# Patient Record
Sex: Female | Born: 1995 | Race: White | Hispanic: No | Marital: Single | Smoking: Never smoker
Health system: Southern US, Community
[De-identification: ages and names within clinical notes are randomized; demographics above are authoritative.]

## PROBLEM LIST (undated history)

## (undated) DIAGNOSIS — G43909 Migraine, unspecified, not intractable, without status migrainosus: Secondary | ICD-10-CM

## (undated) DIAGNOSIS — F329 Major depressive disorder, single episode, unspecified: Secondary | ICD-10-CM

## (undated) DIAGNOSIS — F419 Anxiety disorder, unspecified: Secondary | ICD-10-CM

## (undated) HISTORY — PX: NO PAST SURGERIES: SHX2092

---
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
## 2017-06-12 ENCOUNTER — Emergency Department (HOSPITAL_COMMUNITY)
Admission: EM | Admit: 2017-06-12 | Discharge: 2017-06-13 | Disposition: A | Discharge status: Home / Self Care | Payer: BLUE CROSS/BLUE SHIELD | Attending: Emergency Medicine | Admitting: Emergency Medicine

## 2017-06-12 ENCOUNTER — Encounter (HOSPITAL_COMMUNITY): Payer: Self-pay

## 2017-06-12 DIAGNOSIS — R413 Other amnesia: Secondary | ICD-10-CM | POA: Insufficient documentation

## 2017-06-12 DIAGNOSIS — R519 Headache, unspecified: Secondary | ICD-10-CM

## 2017-06-12 DIAGNOSIS — Z79899 Other long term (current) drug therapy: Secondary | ICD-10-CM | POA: Insufficient documentation

## 2017-06-12 DIAGNOSIS — R51 Headache: Secondary | ICD-10-CM | POA: Insufficient documentation

## 2017-06-12 HISTORY — DX: Major depressive disorder, single episode, unspecified: F32.9

## 2017-06-12 HISTORY — DX: Migraine, unspecified, not intractable, without status migrainosus: G43.909

## 2017-06-12 NOTE — ED Triage Notes (Signed)
Patient reports a migraine headache approx 6 days ago with intermittent nausea, but denies vomiting. Patient states during conversation she completely loses her train of thought. patient states this memory loss started 04/03/17 and has gotten progressively worse. Patient states she has been taking Topramax for the migraines,mb ut her migraine returned. Patient state she has continued taking the Topramax.

## 2017-06-13 ENCOUNTER — Emergency Department (HOSPITAL_COMMUNITY): Payer: BLUE CROSS/BLUE SHIELD

## 2017-06-13 LAB — POC URINE PREG, ED: PREG TEST UR: NEGATIVE

## 2017-06-13 NOTE — ED Provider Notes (Signed)
[facility]-EMERGENCY DEPT Provider Note   CSN: [id number] Arrival date & time: 06/12/17  1808     History   Chief Complaint Chief Complaint  Patient presents with  . Migraine  . Memory Loss    HPI Jaunice Mirza is a 21 y.o. female.  Patient presents to the emergency department for evaluation of worsening neurologic symptoms that have been ongoing for some time.  Patient reports that she has a history of chronic headaches since she was 21 years old.  She is in the area going to college, has a neurologist in [place].  She saw her neurologist a couple of times over the summer for worsening headaches, memory loss.  Patient has been on Topamax and her neurologist wanted to increase the Topamax, but patient thought that was causing her memory loss, increase was never done.  Patient reports that she had an episode 3 days ago where she had numbness and tingling of both of her hands and around her mouth.  It lasted for 4 hours and then resolved.  Tonight she has had increased headache.  She reports that the headache is always there, every day and never leaves, waxes and wanes.  It is 6 out of 10 now.  Patient also reports that she was in class earlier today, did not remember going to class or anything happening in the class, suddenly became aware where she was when the class was being dismissed.  She reports that she had taken notes, but had no recollection of that.      Past Medical History:  Diagnosis Date  . Anxiety   . Major depression   . Migraines     Patient Active Problem List   Diagnosis Date Noted  . Adjustment disorder with mixed anxiety and depressed mood 09/17/2016    Past Surgical History:  Procedure Laterality Date  . NO PAST SURGERIES      OB History    No data available       Home Medications    Prior to Admission medications   Medication Sig Start Date End Date Taking? Authorizing Provider  buPROPion (WELLBUTRIN XL) 150 MG 24 hr  tablet Take 150 mg by mouth daily. 05/26/17  Yes [provider]  buPROPion (WELLBUTRIN XL) 300 MG 24 hr tablet Take 300 mg by mouth daily. 05/23/17  Yes [provider]  hydrOXYzine (ATARAX/VISTARIL) 25 MG tablet Take 1 tablet (25 mg total) by mouth every 6 (six) hours as needed for anxiety. 09/18/16  Yes Withrow, Everardo All, FNP  MICROGESTIN FE 1/20 1-20 MG-MCG tablet Take 1 tablet by mouth daily. 07/12/16  Yes [provider]  propranolol (INDERAL) 10 MG tablet Take 10 mg by mouth daily as needed. 03/08/17  Yes [provider]  topiramate (TOPAMAX) 25 MG tablet Take 25 mg by mouth 2 (two) times daily. 05/22/17  Yes [provider]  citalopram (CELEXA) 10 MG tablet Take 1 tablet (10 mg total) by mouth daily. Patient not taking: Reported on 06/13/2017 09/19/16   Beau Fanny, FNP    Family History Family History  Problem Relation Age of Onset  . Anxiety disorder Mother   . Depression Mother     Social History Social History  Substance Use Topics  . Smoking status: Never Smoker  . Smokeless tobacco: Never Used  . Alcohol use No     Allergies   Lactose intolerance (gi) and Tamiflu [oseltamivir phosphate]   Review of Systems Review of Systems  Neurological: Positive  for numbness and headaches.  Psychiatric/Behavioral: Positive for decreased concentration.  All other systems reviewed and are negative.    Physical Exam Updated Vital Signs BP 126/75 (BP Location: Left Arm)   Pulse 87   Temp 98.5 F (36.9 C) (Oral)   Resp 16   Ht 5\' 4"  (1.626 m)   Wt 54.4 kg (120 lb)   LMP 05/29/2017   SpO2 100%   BMI 20.60 kg/m   Physical Exam  Constitutional: She is oriented to person, place, and time. She appears well-developed and well-nourished. No distress.  HENT:  Head: Normocephalic and atraumatic.  Right Ear: Hearing normal.  Left Ear: Hearing normal.  Nose: Nose normal.  Mouth/Throat: Oropharynx is clear and moist and mucous  membranes are normal.  Eyes: Pupils are equal, round, and reactive to light. Conjunctivae and EOM are normal.  Neck: Normal range of motion. Neck supple.  Cardiovascular: Regular rhythm, S1 normal and S2 normal.  Exam reveals no gallop and no friction rub.   No murmur heard. Pulmonary/Chest: Effort normal and breath sounds normal. No respiratory distress. She exhibits no tenderness.  Abdominal: Soft. Normal appearance and bowel sounds are normal. There is no hepatosplenomegaly. There is no tenderness. There is no rebound, no guarding, no tenderness at McBurney's point and negative Murphy's sign. No hernia.  Musculoskeletal: Normal range of motion.  Neurological: She is alert and oriented to person, place, and time. She has normal strength. No cranial nerve deficit or sensory deficit. Coordination normal. GCS eye subscore is 4. GCS verbal subscore is 5. GCS motor subscore is 6.  Skin: Skin is warm, dry and intact. No rash noted. No cyanosis.  Psychiatric: She has a normal mood and affect. Her speech is normal and behavior is normal. Thought content normal.  Nursing note and vitals reviewed.    ED Treatments / Results  Labs (all labs ordered are listed, but only abnormal results are displayed) Labs Reviewed - No data to display  EKG  EKG Interpretation None       Radiology Ct Head Wo Contrast  Result Date: 06/13/2017 CLINICAL DATA:  Acute onset of migraine headache and nausea. Memory loss. Initial encounter. EXAM: CT HEAD WITHOUT CONTRAST TECHNIQUE: Contiguous axial images were obtained from the base of the skull through the vertex without intravenous contrast. COMPARISON:  None. FINDINGS: Brain: No evidence of acute infarction, hemorrhage, hydrocephalus, extra-axial collection or mass lesion/mass effect. The posterior fossa, including the cerebellum, brainstem and fourth ventricle, is within normal limits. The third and lateral ventricles, and basal ganglia are unremarkable in  appearance. The cerebral hemispheres are symmetric in appearance, with normal gray-white differentiation. No mass effect or midline shift is seen. Vascular: No hyperdense vessel or unexpected calcification. Skull: There is no evidence of fracture; visualized osseous structures are unremarkable in appearance. Sinuses/Orbits: The orbits are within normal limits. The paranasal sinuses and mastoid air cells are well-aerated. Other: No significant soft tissue abnormalities are seen. IMPRESSION: Unremarkable noncontrast CT of the head. Electronically Signed   By: Roanna Raider M.D.   On: 06/13/2017 01:25    Procedures Procedures (including critical care time)  Medications Ordered in ED Medications - No data to display   Initial Impression / Assessment and Plan / ED Course  I have reviewed the triage vital signs and the nursing notes.  Pertinent labs & imaging results that were available during my care of the patient were reviewed by me and considered in my medical decision making (see chart for details).  Patient presents to the ER for evaluation of chronic and worsening headache with associated neurologic features.  Patient reports worsening memory loss.  She had perioral and bilateral hand numbness and tingling several days ago.  This sounds consistent with hyperventilation syndrome, likely has some anxiety and psychiatric component to her symptoms.  [place] in [place] as well as neurology.  Patient reporting worsening headaches, however, therefore head CT had performed.  Will refer to neurology for further help with her headaches and neurologic symptoms.  Final Clinical Impressions(s) / ED Diagnoses   Final diagnoses:  Memory loss  Bad headache    New Prescriptions New Prescriptions   No medications on file     Gilda CreasePollina, Christopher J, MD 06/13/17 0202

## 2017-06-28 ENCOUNTER — Encounter: Payer: Self-pay | Admitting: Neurology

## 2017-10-12 ENCOUNTER — Ambulatory Visit (INDEPENDENT_AMBULATORY_CARE_PROVIDER_SITE_OTHER): Payer: BLUE CROSS/BLUE SHIELD | Admitting: Neurology

## 2017-10-12 ENCOUNTER — Encounter: Payer: Self-pay | Admitting: Neurology

## 2017-10-12 VITALS — BP 96/68 | HR 92 | Ht 64.0 in | Wt 116.4 lb

## 2017-10-12 DIAGNOSIS — G43709 Chronic migraine without aura, not intractable, without status migrainosus: Secondary | ICD-10-CM | POA: Diagnosis not present

## 2017-10-12 MED ORDER — AMITRIPTYLINE HCL 25 MG PO TABS: 25.0000 mg | ORAL_TABLET | Freq: Every day | ORAL | 3 refills | Status: DC

## 2017-10-12 NOTE — Progress Notes (Addendum)
NEUROLOGY CONSULTATION NOTE  Kristin Wagner MRN: [id number] DOB: 08/03/1996  Referring provider: ED referral Primary care provider: No PCP  Reason for consult:  headache  HISTORY OF PRESENT ILLNESS: Kristin Wagner is a  22 year old female with major depression who presents for headache and memory loss.  History supplemented by ED notes.    She has had a constant headache since she was 22 years old.  Semiology changes.  Location of headaches are variable (retro-orbital, frontal, temporal, unilateral or bilateral), often squeezing sensation.  They are dull with fluctuations to severe.  Severe headaches occur about 3 to 4 days a week and typically last 4 hours.  There is associated nausea and some photophobia and phonophobia.  No unilateral numbness or weakness.  Stress is a significant trigger.  Nothing is effective in relieving it.  She once had a visual aura with scotoma but otherwise no visual symptoms.  She is currently not taking any pain relievers. Current NSAIDS:  no Current analgesics:  no Current triptans:  no Current anti-emetic:  no Current muscle relaxants:  no Current anti-anxiolytic:  hydroxyzine Current sleep aide:  no Current Antihypertensive medications:  no Current Antidepressant medications:  Amitriptyline 10mg  to 20mg  at bedtime, bupropion Current Anticonvulsant medications:  topiramate 25mg  twice daily Current Vitamins/Herbal/Supplements:  no Current Antihistamines/Decongestants:  no Other therapy:  no  Past NSAIDS:  ibuprofen Past analgesics:  Excedrin Migraine (ineffective) Past abortive triptans:  no Past muscle relaxants:  no Past anti-emetic:  no Past antihypertensive medications:  Propranolol 10mg  (increased depression) Past antidepressant medications:  citalopram Past anticonvulsant medications:  no Past vitamins/Herbal/Supplements:  no Past antihistamines/decongestants:  no Other past therapies:  no  Caffeine:  no Alcohol:  no Smoker:   no Diet:  Hydrates.   Exercise:  walks Depression:  Yes but stable; Anxiety:  yes Sleep hygiene:  varies Family history of headache:  no  She goes to school in [place] but is from [place], where she has a neurologist and psychiatrist.  On 06/12/17, she presented to the ED with worsening headache.  For several months, she has had memory problems.  She saw a neurologist in [place] who thought it might be due to the topiramate, however symptoms started prior to topiramate.  Sometimes she does not understand the teacher in class.  On day of her ED visit, she had an amnestic episode in school.  She suddenly found herself sitting in class and did not remember going to class or anything that occurred while in class.  She looked down at her desk and saw that she had written notes but had no recollection of her actions.  She also endorsed a prior episode of numbness and tingling of both hands and around her mouth lasting 4 hours.  CT of head was personally reviewed and was unremarkable.  She has major depression.  She also has been seen in urgent care and the ED for atypical chest pain with shortness of breath.  She has a Therapist, sports in [place].  She says she is doing well in school.  PAST MEDICAL HISTORY: Past Medical History:  Diagnosis Date  . Anxiety   . Major depression   . Migraines     PAST SURGICAL HISTORY: Past Surgical History:  Procedure Laterality Date  . NO PAST SURGERIES      MEDICATIONS: Current Outpatient Medications on File Prior to Visit  Medication Sig Dispense Refill  . buPROPion (WELLBUTRIN XL) 150 MG 24 hr tablet Take 450 mg by mouth daily.  0  . buPROPion (WELLBUTRIN XL) 300 MG 24 hr tablet Take 300 mg by mouth daily.  0  . citalopram (CELEXA) 10 MG tablet Take 1 tablet (10 mg total) by mouth daily. (Patient not taking: Reported on 06/13/2017) 30 tablet 0  . hydrOXYzine (ATARAX/VISTARIL) 25 MG tablet Take 1 tablet (25 mg total) by mouth every 6 (six) hours as  needed for anxiety. 60 tablet 0  . MICROGESTIN FE 1/20 1-20 MG-MCG tablet Take 1 tablet by mouth daily.  3  . propranolol (INDERAL) 10 MG tablet Take 10 mg by mouth daily as needed.  1  . topiramate (TOPAMAX) 25 MG tablet Take 25 mg by mouth 2 (two) times daily.  0   No current facility-administered medications on file prior to visit.     ALLERGIES: Allergies  Allergen Reactions  . Lactose Intolerance (Gi)   . Tamiflu [Oseltamivir Phosphate] Hives and Nausea And Vomiting    FAMILY HISTORY: Family History  Problem Relation Age of Onset  . Anxiety disorder Mother   . Depression Mother    .  SOCIAL HISTORY: Social History   Socioeconomic History  . Marital status: Single    Spouse name: Not on file  . Number of children: Not on file  . Years of education: Not on file  . Highest education level: Not on file  Social Needs  . Financial resource strain: Not on file  . Food insecurity - worry: Not on file  . Food insecurity - inability: Not on file  . Transportation needs - medical: Not on file  . Transportation needs - non-medical: Not on file  Occupational History  . Not on file  Tobacco Use  . Smoking status: Never Smoker  . Smokeless tobacco: Never Used  Substance and Sexual Activity  . Alcohol use: No  . Drug use: No  . Sexual activity: Not Currently    Birth control/protection: Pill  Other Topics Concern  . Not on file  Social History Narrative  . Not on file    REVIEW OF SYSTEMS: Constitutional: No fevers, chills, or sweats, no generalized fatigue, change in appetite Eyes: No visual changes, double vision, eye pain Ear, nose and throat: No hearing loss, ear pain, nasal congestion, sore throat Cardiovascular: No chest pain, palpitations Respiratory:  No shortness of breath at rest or with exertion, wheezes GastrointestinaI: No nausea, vomiting, diarrhea, abdominal pain, fecal incontinence Genitourinary:  No dysuria, urinary retention or  frequency Musculoskeletal:  No neck pain, back pain Integumentary: No rash, pruritus, skin lesions Neurological: as above Psychiatric: depression, insomnia, anxiety Endocrine: No palpitations, fatigue, diaphoresis, mood swings, change in appetite, change in weight, increased thirst Hematologic/Lymphatic:  No purpura, petechiae. Allergic/Immunologic: no itchy/runny eyes, nasal congestion, recent allergic reactions, rashes  PHYSICAL EXAM: Vitals:   10/12/17 1458  BP: 96/68  Pulse: 92  SpO2: 98%   General: No acute distress.  Patient appears well-groomed.  Head:  Normocephalic/atraumatic Eyes:  fundi examined but not visualized Neck: supple, no paraspinal tenderness, full range of motion Back: No paraspinal tenderness Heart: regular rate and rhythm Lungs: Clear to auscultation bilaterally. Vascular: No carotid bruits. Neurological Exam: Mental status: alert and oriented to person, place, and time, recent and remote memory intact, fund of knowledge intact, attention and concentration intact, speech fluent and not dysarthric, language intact. Cranial nerves: CN I: not tested CN II: pupils equal, round and reactive to light, visual fields intact CN III, IV, VI:  full range of motion, no nystagmus, no ptosis CN V:  facial sensation intact CN VII: upper and lower face symmetric CN VIII: hearing intact CN IX, X: gag intact, uvula midline CN XI: sternocleidomastoid and trapezius muscles intact CN XII: tongue midline Bulk & Tone: normal, no fasciculations. Motor:  5/5 throughout  Sensation: temperature and vibration sensation intact. Deep Tendon Reflexes:  2+ throughout, toes downgoing.  Finger to nose testing:  Without dysmetria.  Heel to shin:  Without dysmetria.  Gait:  Normal station and stride.  Able to turn. Romberg negative.  IMPRESSION: Chronic migraine without aura Memory deficits likely related to underlying depression and not organic.  Due to current memory symptoms, I  would rather not increase topiramate.  We will increase amitriptyline instead.  PLAN: 1.  I will prescribe you a higher dose of amitriptyline.  Take 25mg  at bedtime.  If headaches not improved in 4 weeks, contact me and I will increase dose. 2.  Limit use of pain relievers to no more than 2 days out of the week.  These medications include acetaminophen, ibuprofen, triptans and narcotics.  This will help reduce risk of rebound headaches. 3.  Be aware of common food triggers such as processed sweets, processed foods with nitrites (such as deli meat, hot dogs, sausages), foods with MSG, alcohol (such as wine), chocolate, certain cheeses, certain fruits (dried fruits, bananas, some citrus fruit), vinegar, diet soda. 4.  Avoid caffeine 5.  Routine exercise 6.  Proper sleep hygiene 7.  Stay adequately hydrated with water 8.  Keep a headache diary. 9.  Maintain proper stress management. 10.  Do not skip meals. 11.  Consider supplements:  Magnesium citrate 400mg  to 600mg  daily, riboflavin 400mg , Coenzyme Q 10 100mg  three times daily 12.  Follow up in 3 months.  Shon MilletAdam Jaffe, DO

## 2017-10-12 NOTE — Patient Instructions (Signed)
1.  I will prescribe you a higher dose of amitriptyline.  Take 25mg  at bedtime.  If headaches not improved in 4 weeks, contact me and I will increase dose. 2.  Limit use of pain relievers to no more than 2 days out of the week.  These medications include acetaminophen, ibuprofen, triptans and narcotics.  This will help reduce risk of rebound headaches. 3.  Be aware of common food triggers such as processed sweets, processed foods with nitrites (such as deli meat, hot dogs, sausages), foods with MSG, alcohol (such as wine), chocolate, certain cheeses, certain fruits (dried fruits, bananas, some citrus fruit), vinegar, diet soda. 4.  Avoid caffeine 5.  Routine exercise 6.  Proper sleep hygiene 7.  Stay adequately hydrated with water 8.  Keep a headache diary. 9.  Maintain proper stress management. 10.  Do not skip meals. 11.  Consider supplements:  Magnesium citrate 400mg  to 600mg  daily, riboflavin 400mg , Coenzyme Q 10 100mg  three times daily 12.  Follow up in 3 months.

## 2017-11-23 ENCOUNTER — Encounter: Payer: Self-pay | Admitting: Neurology

## 2017-12-15 ENCOUNTER — Encounter: Payer: Self-pay | Admitting: Neurology

## 2018-01-26 ENCOUNTER — Ambulatory Visit (INDEPENDENT_AMBULATORY_CARE_PROVIDER_SITE_OTHER): Payer: BLUE CROSS/BLUE SHIELD | Admitting: Neurology

## 2018-01-26 ENCOUNTER — Encounter: Payer: Self-pay | Admitting: Neurology

## 2018-01-26 VITALS — BP 100/68 | HR 108 | Ht 64.0 in | Wt 124.0 lb

## 2018-01-26 DIAGNOSIS — G43709 Chronic migraine without aura, not intractable, without status migrainosus: Secondary | ICD-10-CM | POA: Diagnosis not present

## 2018-01-26 MED ORDER — TOPIRAMATE 25 MG PO TABS: ORAL_TABLET | ORAL | 0 refills | Status: DC

## 2018-01-26 MED ORDER — NAPROXEN 500 MG PO TABS: 500.0000 mg | ORAL_TABLET | Freq: Two times a day (BID) | ORAL | 2 refills | Status: AC | PRN

## 2018-01-26 NOTE — Progress Notes (Signed)
NEUROLOGY FOLLOW UP OFFICE NOTE  Kristin Wagner [id number]  HISTORY OF PRESENT ILLNESS: Kristin HaiCourtney Teehan is a 22 year old female with major depression who presents for headache and memory loss.    UPDATE: Last time, amitriptyline was increased from 10mg  to 25mg .  Migraines slightly improved.  She still has dull daily headaches.  As for migraines:   Intensity:  Moderate to severe Duration:  4 to 6 hours Frequency:  2 to 3 days a week Current NSAIDS:  no Current analgesics:  no Current triptans:  no Current anti-emetic:  no Current muscle relaxants:  no Current anti-anxiolytic:  hydroxyzine Current sleep aide:  no Current Antihypertensive medications:  no Current Antidepressant medications:  Amitriptyline 25mg  to 20mg  at bedtime, bupropion Current Anticonvulsant medications:  topiramate 25mg  once daily Current Vitamins/Herbal/Supplements:  no Current Antihistamines/Decongestants:  no Other therapy:  no  Caffeine:  no Alcohol:  no Smoker:  no Diet:  Hydrates.   Exercise:  walks Depression:  Yes but stable; Anxiety:  yes Sleep hygiene:  varies  HISTORY:  She has had a constant headache since she was 22 years old.  Semiology changes.  Location of headaches are variable (retro-orbital, frontal, temporal, unilateral or bilateral), often squeezing sensation.  They are dull with fluctuations to severe.  Severe headaches occur about 3 to 4 days a week and typically last 4 hours.  There is associated nausea and some photophobia and phonophobia.  No unilateral numbness or weakness.  Stress is a significant trigger.  Nothing is effective in relieving it.  She once had a visual aura with scotoma but otherwise no visual symptoms.    Past NSAIDS:  ibuprofen Past analgesics:  Excedrin Migraine (ineffective) Past abortive triptans:  no Past muscle relaxants:  no Past anti-emetic:  no Past antihypertensive medications:  Propranolol 10mg  (increased depression) Past antidepressant  medications:  citalopram Past anticonvulsant medications:  no Past vitamins/Herbal/Supplements:  no Past antihistamines/decongestants:  no Other past therapies:  no   Family history of headache:  no   She goes to school in [place] but is from [place], where she has a neurologist and psychiatrist.  On 06/12/17, she presented to the ED with worsening headache.  For several months, she has had memory problems.  She saw a neurologist in [place] who thought it might be due to the topiramate, however symptoms started prior to topiramate.  Sometimes she does not understand the teacher in class.  On day of her ED visit, she had an amnestic episode in school.  She suddenly found herself sitting in class and did not remember going to class or anything that occurred while in class.  She looked down at her desk and saw that she had written notes but had no recollection of her actions.  She also endorsed a prior episode of numbness and tingling of both hands and around her mouth lasting 4 hours.  CT of head was personally reviewed and was unremarkable.   She has major depression.  She also has been seen in urgent care and the ED for atypical chest pain with shortness of breath.   She has a Therapist, sportspsychiatrist in [place].  She says she is doing well in school.  PAST MEDICAL HISTORY: Past Medical History:  Diagnosis Date  . Anxiety   . Major depression   . Migraines     MEDICATIONS: Current Outpatient Medications on File Prior to Visit  Medication Sig Dispense Refill  . buPROPion (WELLBUTRIN XL) 150 MG 24 hr tablet Take 450 mg  by mouth daily.   0  . buPROPion (WELLBUTRIN XL) 300 MG 24 hr tablet Take 300 mg by mouth daily.  0  . hydrOXYzine (ATARAX/VISTARIL) 25 MG tablet Take 1 tablet (25 mg total) by mouth every 6 (six) hours as needed for anxiety. 60 tablet 0   No current facility-administered medications on file prior to visit.     ALLERGIES: Allergies  Allergen Reactions  . Lactose Intolerance  (Gi)   . Tamiflu [Oseltamivir Phosphate] Hives and Nausea And Vomiting    FAMILY HISTORY: Family History  Problem Relation Age of Onset  . Anxiety disorder Mother   . Depression Mother     SOCIAL HISTORY: Social History   Socioeconomic History  . Marital status: Single    Spouse name: Not on file  . Number of children: Not on file  . Years of education: Not on file  . Highest education level: Not on file  Occupational History  . Not on file  Social Needs  . Financial resource strain: Not on file  . Food insecurity:    Worry: Not on file    Inability: Not on file  . Transportation needs:    Medical: Not on file    Non-medical: Not on file  Tobacco Use  . Smoking status: Never Smoker  . Smokeless tobacco: Never Used  Substance and Sexual Activity  . Alcohol use: No  . Drug use: No  . Sexual activity: Not Currently    Birth control/protection: Pill  Lifestyle  . Physical activity:    Days per week: Not on file    Minutes per session: Not on file  . Stress: Not on file  Relationships  . Social connections:    Talks on phone: Not on file    Gets together: Not on file    Attends religious service: Not on file    Active member of club or organization: Not on file    Attends meetings of clubs or organizations: Not on file    Relationship status: Not on file  . Intimate partner violence:    Fear of current or ex partner: Not on file    Emotionally abused: Not on file    Physically abused: Not on file    Forced sexual activity: Not on file  Other Topics Concern  . Not on file  Social History Narrative  . Not on file    REVIEW OF SYSTEMS: Constitutional: No fevers, chills, or sweats, no generalized fatigue, change in appetite Eyes: No visual changes, double vision, eye pain Ear, nose and throat: No hearing loss, ear pain, nasal congestion, sore throat Cardiovascular: No chest pain, palpitations Respiratory:  No shortness of breath at rest or with exertion,  wheezes GastrointestinaI: No nausea, vomiting, diarrhea, abdominal pain, fecal incontinence Genitourinary:  No dysuria, urinary retention or frequency Musculoskeletal:  No neck pain, back pain Integumentary: No rash, pruritus, skin lesions Neurological: as above Psychiatric: No depression, insomnia, anxiety Endocrine: No palpitations, fatigue, diaphoresis, mood swings, change in appetite, change in weight, increased thirst Hematologic/Lymphatic:  No purpura, petechiae. Allergic/Immunologic: no itchy/runny eyes, nasal congestion, recent allergic reactions, rashes  PHYSICAL EXAM: Vitals:   01/26/18 1039  BP: 100/68  Pulse: (!) 108  SpO2: 98%   General: No acute distress.  Patient appears well-groomed.  normal body habitus. Head:  Normocephalic/atraumatic Eyes:  Fundi examined but not visualized Neck: supple, no paraspinal tenderness, full range of motion Heart:  Regular rate and rhythm Lungs:  Clear to auscultation bilaterally Back: No  paraspinal tenderness Neurological Exam: alert and oriented to person, place, and time. Attention span and concentration intact, recent and remote memory intact, fund of knowledge intact.  Speech fluent and not dysarthric, language intact.  CN II-XII intact. Bulk and tone normal, muscle strength 5/5 throughout.  Sensation to light touch  intact.  Deep tendon reflexes 2+ throughout.  Finger to nose testing reveals tremulousness in hands when outstretched.  Gait normal, Romberg negative.  IMPRESSION: Chronic migraine without aura  PLAN: We will limit amount of medication.  We will stop amitriptyline and titrate up the topiramate.  She will monitor to see if memory becomes worse and we can either stop it or switch to ER (if effective for migraines).   1.  Increase topiramate to 50mg  at bedtime for 1 week, then 75mg  at bedtime.  She will contact me in 6 weeks and we can increase to 100mg  if needed. 2.  Naproxen 500mg  for abortive therapy, limited to no more  than 2 days out of week to prevent rebound headache 3.  Keep headache diary 4.  Follow up in 3 months.  Shon Millet, DO

## 2018-01-26 NOTE — Patient Instructions (Signed)
1.  Stop amitriptyline 2.  We will increase topiramate 25mg  tablets.  Take 2 tablets at bedtime for 7 days, then increase to 3 tablets (75mg ) at bedtime.  Contact me in 5 to 6 weeks.  If headaches not improved, we can increase dose. 3.  Stop Excedrin.  When you get a headache, take naproxen 500mg .  May repeat dose once after 12 hours as  Needed.  Limit to no more than 2 days out of week 4.  Keep headache diary 5.  Follow up in 3 months.

## 2018-03-02 ENCOUNTER — Other Ambulatory Visit: Payer: Self-pay | Admitting: Neurology

## 2018-05-22 ENCOUNTER — Ambulatory Visit: Payer: Self-pay | Admitting: Neurology

## 2018-05-24 NOTE — Progress Notes (Signed)
NEUROLOGY FOLLOW UP OFFICE NOTE  Kristin Wagner [id number]  HISTORY OF PRESENT ILLNESS: Kristin Wagner is a 22 year old female with major depression who follows up for migraine.    UPDATE: She had her wisdom teeth removed at end of August and migraines almost completely stopped.  She now gets tension type headaches triggered by stress. Intensity:  Mild to moderate Duration:  20 to 60 minutes (a few hours if particularly stressful situation) Frequency:  3 to 4 days a week Frequency of abortive medication: infrequent Current NSAIDS:  Naproxen 500mg  (works but doesn't typically use it) Current analgesics:  Tylenol Current triptans:  no Current ergotamine:  none Current anti-emetic:  none Current muscle relaxants:  none Current anti-anxiolytic:  hydroxyzine Current sleep aide:  none Current Antihypertensive medications:  none Current Antidepressant medications:  Wellbutrin Current Anticonvulsant medications:  topiramate 75mg  at bedtime.  She still notes tingling in the hands. Current anti-CGRP:  none Current Vitamins/Herbal/Supplements:  none Current Antihistamines/Decongestants:  none Other therapy:  no Birth control:  Junel  Depression:  stable; Anxiety:  yes Other pain:  no   HISTORY: She has had a constant headache since she was 22 years old.  Semiology changes.  Location of headaches are variable (retro-orbital, frontal, temporal, unilateral or bilateral), often squeezing sensation.  They are dull with fluctuations to severe.  Severe headaches occur about 3 to 4 days a week and typically last 4 hours.  There is associated nausea and some photophobia and phonophobia.  No unilateral numbness or weakness.  Stress is a significant trigger.  Nothing is effective in relieving it.  She once had a visual aura with scotoma but otherwise no visual symptoms.  Past NSAIDS:  ibuprofen Past analgesics:  Excedrin Migraine (ineffective) Past abortive triptans:  no Past muscle  relaxants:  no Past anti-emetic:  no Past antihypertensive medications:  Propranolol 10mg  (increased depression) Past antidepressant medications:  citalopram, amitriptyline Past anticonvulsant medications:  no Past vitamins/Herbal/Supplements:  no Past antihistamines/decongestants:  no Other past therapies:  no  Family history of headache:  no  She goes to school in [place] but is from [place], where she has a neurologist and psychiatrist.  On 06/12/17, she presented to the ED with worsening headache.  For several months, she has had memory problems.  She saw a neurologist in [place] who thought it might be due to the topiramate, however symptoms started prior to topiramate.  Sometimes she does not understand the teacher in class.  On day of her ED visit, she had an amnestic episode in school.  She suddenly found herself sitting in class and did not remember going to class or anything that occurred while in class.  She looked down at her desk and saw that she had written notes but had no recollection of her actions.  She also endorsed a prior episode of numbness and tingling of both hands and around her mouth lasting 4 hours.  CT of head was personally reviewed and was unremarkable.  She has major depression.  She also has been seen in urgent care and the ED for atypical chest pain with shortness of breath.  She has a Therapist, sports in [place].  She says she is doing well in school.  PAST MEDICAL HISTORY: Past Medical History:  Diagnosis Date  . Anxiety   . Major depression   . Migraines     MEDICATIONS: Current Outpatient Medications on File Prior to Visit  Medication Sig Dispense Refill  . buPROPion (WELLBUTRIN XL) 150 MG  24 hr tablet Take 450 mg by mouth daily.   0  . buPROPion (WELLBUTRIN XL) 300 MG 24 hr tablet Take 300 mg by mouth daily.  0  . hydrOXYzine (ATARAX/VISTARIL) 25 MG tablet Take 1 tablet (25 mg total) by mouth every 6 (six) hours as needed for anxiety. 60  tablet 0  . naproxen (NAPROSYN) 500 MG tablet Take 1 tablet (500 mg total) by mouth every 12 (twelve) hours as needed. 16 tablet 2  . topiramate (TOPAMAX) 25 MG tablet TAKE 2 TABLETS BY MOUTH AT BEDTIME FOR 7 DAYS. INCREASE TO 3 TABLETS AT BEDTIME 90 tablet 5   No current facility-administered medications on file prior to visit.     ALLERGIES: Allergies  Allergen Reactions  . Lactose Intolerance (Gi)   . Tamiflu [Oseltamivir Phosphate] Hives and Nausea And Vomiting    FAMILY HISTORY: Family History  Problem Relation Age of Onset  . Anxiety disorder Mother   . Depression Mother    SOCIAL HISTORY: Social History   Socioeconomic History  . Marital status: Single    Spouse name: Not on file  . Number of children: Not on file  . Years of education: Not on file  . Highest education level: Not on file  Occupational History  . Not on file  Social Needs  . Financial resource strain: Not on file  . Food insecurity:    Worry: Not on file    Inability: Not on file  . Transportation needs:    Medical: Not on file    Non-medical: Not on file  Tobacco Use  . Smoking status: Never Smoker  . Smokeless tobacco: Never Used  Substance and Sexual Activity  . Alcohol use: No  . Drug use: No  . Sexual activity: Not Currently    Birth control/protection: Pill  Lifestyle  . Physical activity:    Days per week: Not on file    Minutes per session: Not on file  . Stress: Not on file  Relationships  . Social connections:    Talks on phone: Not on file    Gets together: Not on file    Attends religious service: Not on file    Active member of club or organization: Not on file    Attends meetings of clubs or organizations: Not on file    Relationship status: Not on file  . Intimate partner violence:    Fear of current or ex partner: Not on file    Emotionally abused: Not on file    Physically abused: Not on file    Forced sexual activity: Not on file  Other Topics Concern  . Not on  file  Social History Narrative  . Not on file    REVIEW OF SYSTEMS: Constitutional: No fevers, chills, or sweats, no generalized fatigue, change in appetite Eyes: No visual changes, double vision, eye pain Ear, nose and throat: No hearing loss, ear pain, nasal congestion, sore throat Cardiovascular: No chest pain, palpitations Respiratory:  No shortness of breath at rest or with exertion, wheezes GastrointestinaI: No nausea, vomiting, diarrhea, abdominal pain, fecal incontinence Genitourinary:  No dysuria, urinary retention or frequency Musculoskeletal:  No neck pain, back pain Integumentary: No rash, pruritus, skin lesions Neurological: as above Psychiatric: No depression, insomnia, anxiety Endocrine: No palpitations, fatigue, diaphoresis, mood swings, change in appetite, change in weight, increased thirst Hematologic/Lymphatic:  No purpura, petechiae. Allergic/Immunologic: no itchy/runny eyes, nasal congestion, recent allergic reactions, rashes  PHYSICAL EXAM: Blood pressure 92/70, pulse 98, height 5'  4" (1.626 m), weight 117 lb (53.1 kg), SpO2 97 %. General: No acute distress.  Patient appears well-groomed.  Head:  Normocephalic/atraumatic Eyes:  Fundi examined but not visualized Neck: supple, no paraspinal tenderness, full range of motion Heart:  Regular rate and rhythm Lungs:  Clear to auscultation bilaterally Back: No paraspinal tenderness Neurological Exam: alert and oriented to person, place, and time. Attention span and concentration intact, recent and remote memory intact, fund of knowledge intact.  Speech fluent and not dysarthric, language intact.  CN II-XII intact. Bulk and tone normal, muscle strength 5/5 throughout.  Sensation to light touch  intact.  Deep tendon reflexes 2+ throughout.  Finger to nose testing intact.  Gait normal, Romberg negative.  IMPRESSION: Migraine without aura, without status migrainosus, not intractable Tension-type headache, not  intractable.  PLAN: 1.  To reduce risk of cognitive clouding and paresthesias, switch topiramate to ER and increase dose to 100mg  at bedtime to try and reduce frequency of tension type headaches. 2.  Limit use of pain relievers to no more than 2 days out of week to prevent risk of rebound or medication-overuse headache. 3.  Keep headache diary 4.  Follow up in 6 months.  Shon Millet, DO

## 2018-05-25 ENCOUNTER — Encounter: Payer: Self-pay | Admitting: Neurology

## 2018-05-25 ENCOUNTER — Ambulatory Visit (INDEPENDENT_AMBULATORY_CARE_PROVIDER_SITE_OTHER): Payer: BLUE CROSS/BLUE SHIELD | Admitting: Neurology

## 2018-05-25 VITALS — BP 92/70 | HR 98 | Ht 64.0 in | Wt 117.0 lb

## 2018-05-25 DIAGNOSIS — G44219 Episodic tension-type headache, not intractable: Secondary | ICD-10-CM

## 2018-05-25 DIAGNOSIS — G43009 Migraine without aura, not intractable, without status migrainosus: Secondary | ICD-10-CM | POA: Diagnosis not present

## 2018-05-25 MED ORDER — TOPIRAMATE ER 100 MG PO CAP24: 100.0000 mg | ORAL_CAPSULE | Freq: Every day | ORAL | 5 refills | Status: AC

## 2018-05-25 MED ORDER — TOPIRAMATE ER 100 MG PO CAP24: 100.0000 mg | ORAL_CAPSULE | Freq: Every day | ORAL | 0 refills | Status: AC

## 2018-05-25 NOTE — Patient Instructions (Signed)
1.  For preventative management, we will switch and increase topiramate to topiramate ER (Trokendi XR) 100mg  at bedtime 2. Limit use of pain relievers to no more than 2 days out of week to prevent risk of rebound or medication-overuse headache. 3.  Keep headache diary 4.  Exercise, hydration, caffeine cessation, sleep hygiene, monitor for and avoid triggers 5.  Consider:  magnesium citrate 400mg  daily, riboflavin 400mg  daily, and coenzyme Q10 100mg  three times daily 6.  Follow up in 6 months,

## 2018-11-26 ENCOUNTER — Ambulatory Visit: Payer: Self-pay | Admitting: Neurology

## 2019-01-08 IMAGING — CT CT HEAD W/O CM
3 of 4 series · 15 of 47 positions shown, 18 images · non-contrast
Comparison: None.

CLINICAL DATA: Acute onset of migraine headache and nausea. Memory
loss. Initial encounter.

EXAM:
CT HEAD WITHOUT CONTRAST
TECHNIQUE: Contiguous axial images were obtained from the base of the skull
through the vertex without intravenous contrast.

[Series 2: head w/o · axial · non-contrast · 0.45mm/px · z∈[+1281,+1401]mm · 9 of 29 slices shown, 12 images]
[im 3/29  brain]
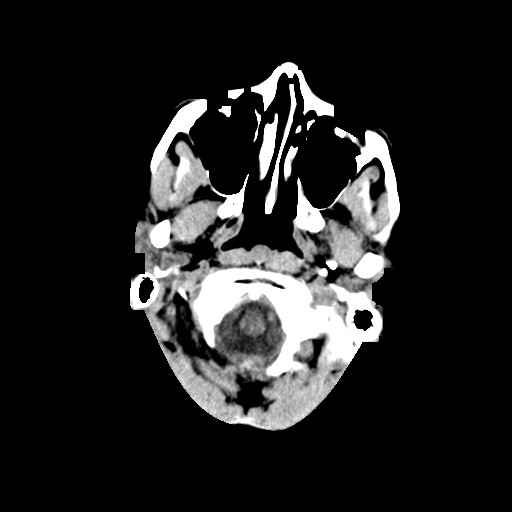
[im 3/29  bone]
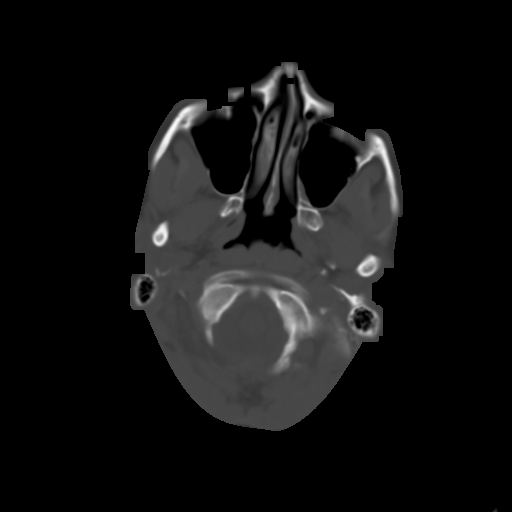
[im 7/29  brain]
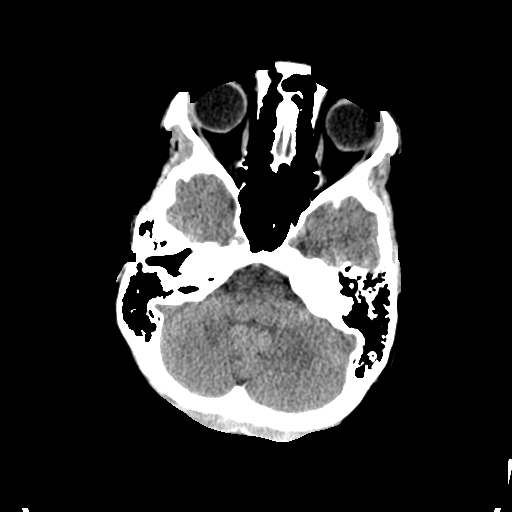
[im 9/29  brain]
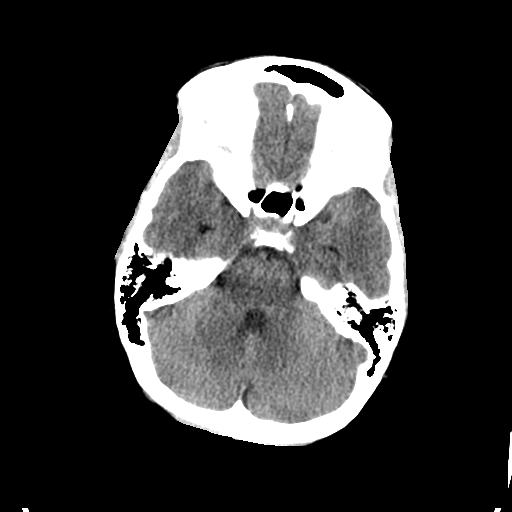
[im 13/29  brain]
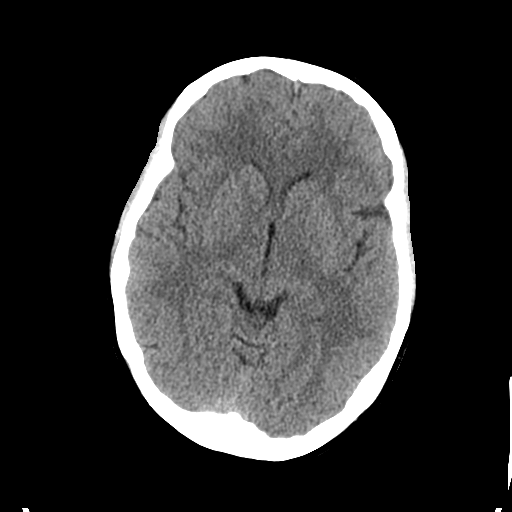
[im 15/29  brain]
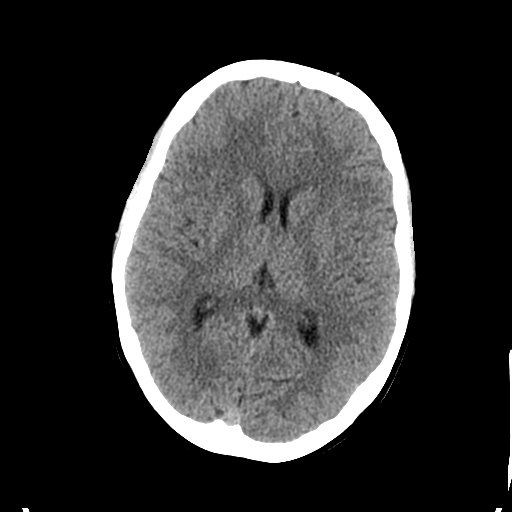
[im 15/29  bone]
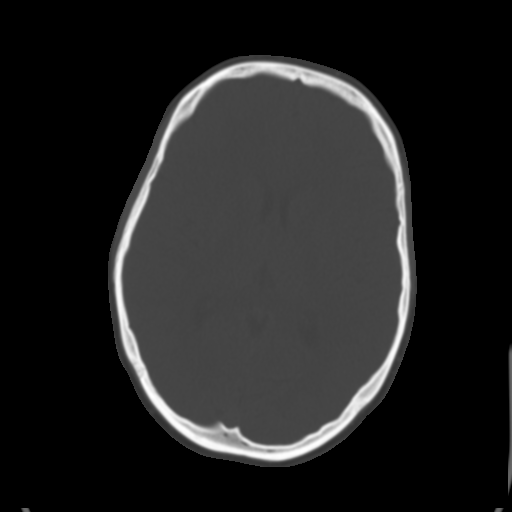
[im 17/29  brain]
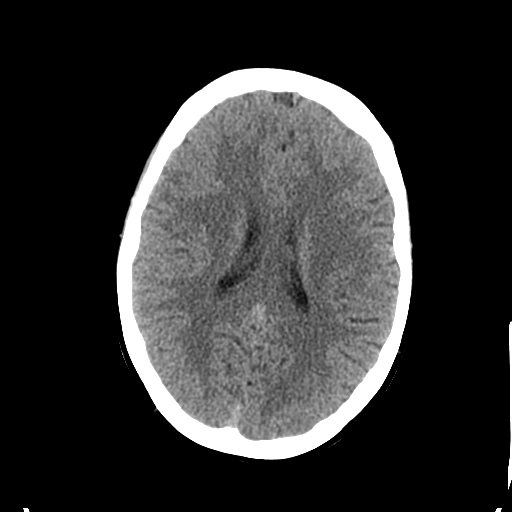
[im 21/29  brain]
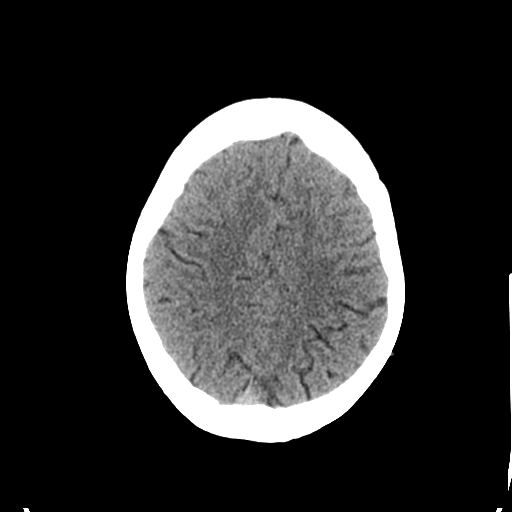
[im 23/29  brain]
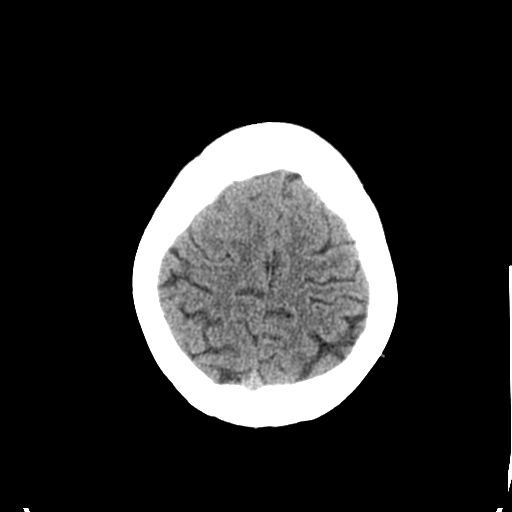
[im 27/29  brain]
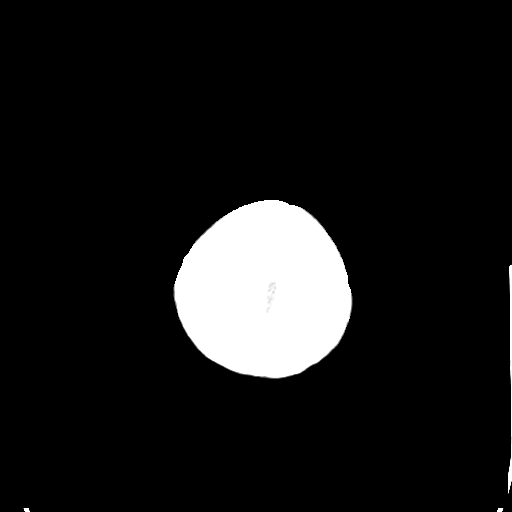
[im 27/29  bone]
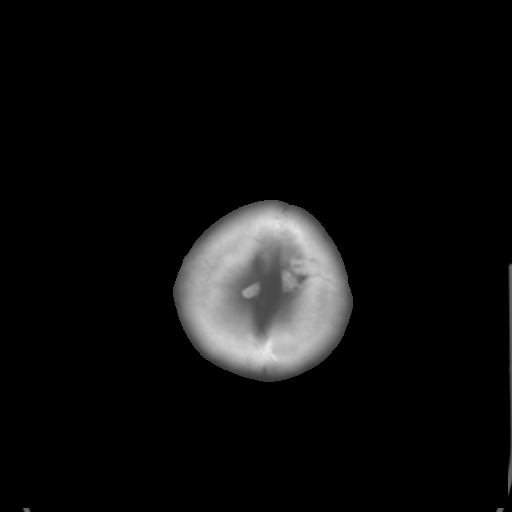

[Series 4: coronal · coronal · 0.28mm/px · 3 of 60 slices shown]
[im 20/60  brain]
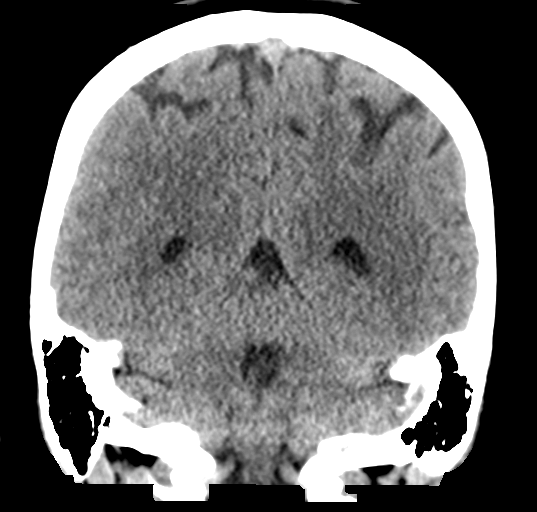
[im 27/60  brain]
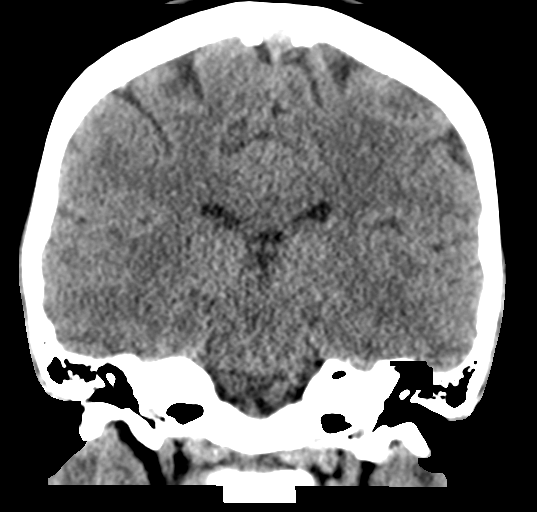
[im 33/60  brain]
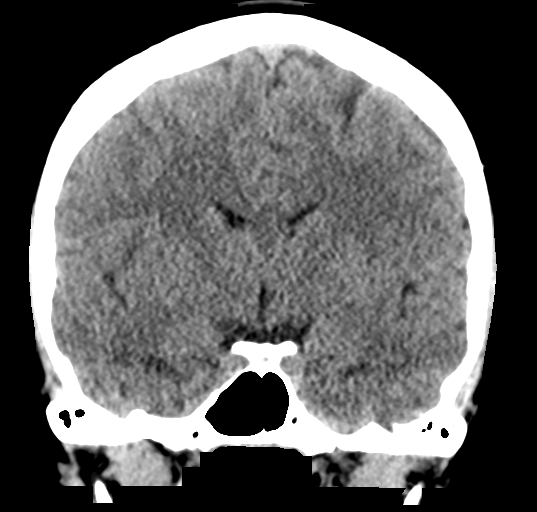

[Series 5: sagittal · sagittal · 0.28mm/px · 3 of 46 slices shown]
[im 16/46  brain]
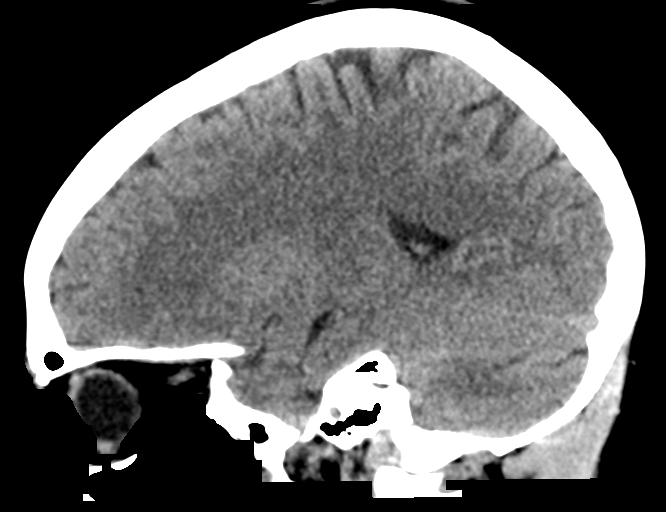
[im 23/46  brain]
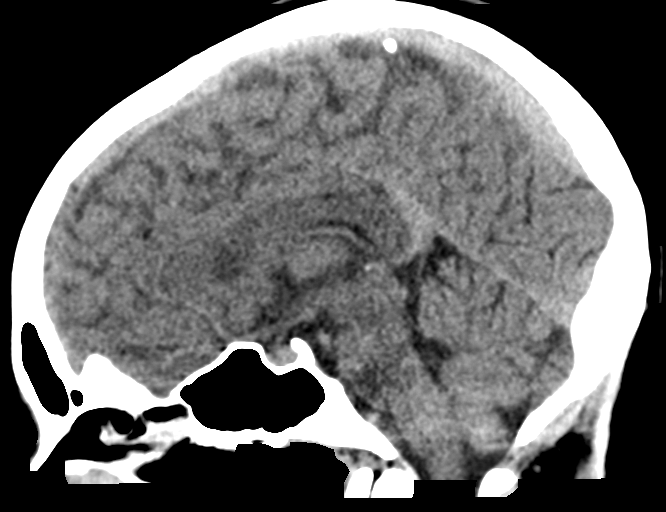
[im 31/46  brain]
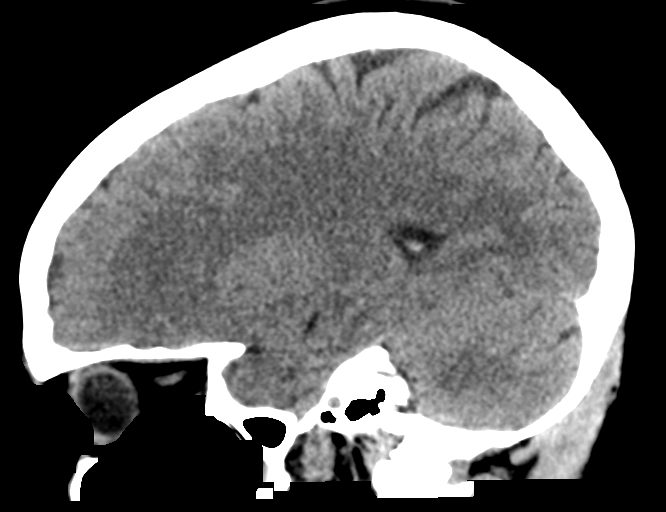

[15 of 47 positions shown; findings below may reference images not displayed]

FINDINGS: Brain: No evidence of acute infarction, hemorrhage, hydrocephalus,
extra-axial collection or mass lesion/mass effect.

The posterior fossa, including the cerebellum, brainstem and fourth
ventricle, is within normal limits. The third and lateral
ventricles, and basal ganglia are unremarkable in appearance. The
cerebral hemispheres are symmetric in appearance, with normal
gray-white differentiation. No mass effect or midline shift is seen.

Vascular: No hyperdense vessel or unexpected calcification.

Skull: There is no evidence of fracture; visualized osseous
structures are unremarkable in appearance.

Sinuses/Orbits: The orbits are within normal limits. The paranasal
sinuses and mastoid air cells are well-aerated.

Other: No significant soft tissue abnormalities are seen.
IMPRESSION: Unremarkable noncontrast CT of the head.
